# Patient Record
Sex: Male | Born: 1990 | Hispanic: No | Marital: Single
Health system: Southern US, Community
[De-identification: ages and names within clinical notes are randomized; demographics above are authoritative.]

---

## 2016-06-25 ENCOUNTER — Emergency Department (HOSPITAL_COMMUNITY): Payer: BLUE CROSS/BLUE SHIELD

## 2016-06-25 ENCOUNTER — Encounter (HOSPITAL_COMMUNITY): Payer: Self-pay | Admitting: *Deleted

## 2016-06-25 ENCOUNTER — Emergency Department (HOSPITAL_COMMUNITY)
Admission: EM | Admit: 2016-06-25 | Discharge: 2016-06-25 | Disposition: A | Discharge status: Home / Self Care | Payer: BLUE CROSS/BLUE SHIELD | Attending: Emergency Medicine | Admitting: Emergency Medicine

## 2016-06-25 DIAGNOSIS — S52515A Nondisplaced fracture of left radial styloid process, initial encounter for closed fracture: Secondary | ICD-10-CM | POA: Insufficient documentation

## 2016-06-25 DIAGNOSIS — Y9355 Activity, bike riding: Secondary | ICD-10-CM | POA: Insufficient documentation

## 2016-06-25 DIAGNOSIS — S52615A Nondisplaced fracture of left ulna styloid process, initial encounter for closed fracture: Secondary | ICD-10-CM | POA: Diagnosis not present

## 2016-06-25 DIAGNOSIS — Y92828 Other wilderness area as the place of occurrence of the external cause: Secondary | ICD-10-CM | POA: Diagnosis not present

## 2016-06-25 DIAGNOSIS — M25511 Pain in right shoulder: Secondary | ICD-10-CM

## 2016-06-25 DIAGNOSIS — Y999 Unspecified external cause status: Secondary | ICD-10-CM | POA: Diagnosis not present

## 2016-06-25 DIAGNOSIS — Z23 Encounter for immunization: Secondary | ICD-10-CM | POA: Insufficient documentation

## 2016-06-25 DIAGNOSIS — S6992XA Unspecified injury of left wrist, hand and finger(s), initial encounter: Secondary | ICD-10-CM | POA: Diagnosis present

## 2016-06-25 MED ORDER — IBUPROFEN 600 MG PO TABS: 600.0000 mg | ORAL_TABLET | Freq: Four times a day (QID) | ORAL | 0 refills | Status: AC | PRN

## 2016-06-25 MED ORDER — METHOCARBAMOL 500 MG PO TABS: 500.0000 mg | ORAL_TABLET | Freq: Every evening | ORAL | 0 refills | Status: AC | PRN

## 2016-06-25 MED ORDER — OXYCODONE-ACETAMINOPHEN 5-325 MG PO TABS
1.0000 | ORAL_TABLET | Freq: Once | ORAL | Status: AC
  Administered 2016-06-25: 1 via ORAL
  Filled 2016-06-25: qty 1

## 2016-06-25 MED ORDER — TETANUS-DIPHTH-ACELL PERTUSSIS 5-2.5-18.5 LF-MCG/0.5 IM SUSP
0.5000 mL | Freq: Once | INTRAMUSCULAR | Status: AC
  Administered 2016-06-25: 0.5 mL via INTRAMUSCULAR
  Filled 2016-06-25: qty 0.5
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
## 2016-06-25 NOTE — Progress Notes (Signed)
Orthopedic Tech Progress Note Patient Details:  Ladona HornsDaniel Nuss 05/21/1990 [id number]  Ortho Devices Type of Ortho Device: Ace wrap, Arm sling, Sugartong splint Ortho Device/Splint Location: lue Ortho Device/Splint Interventions: Application   Guadamuz, Ahmere Hemenway 06/25/2016, 12:44 PM

## 2016-06-25 NOTE — ED Notes (Signed)
Declined W/C at D/C and was escorted to lobby by RN. 
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
## 2016-06-25 NOTE — Discharge Instructions (Addendum)
No lifting, pushing, or pulling with left hand.  Keep splint dry and intact.  Take Ibuprofen three times daily. Take muscle relaxer as needed
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
